# Patient Record
Sex: Male | Born: 1995 | Race: White | Hispanic: No | Marital: Single | Smoking: Never smoker
Health system: Southern US, Community
[De-identification: ages and names within clinical notes are randomized; demographics above are authoritative.]

## PROBLEM LIST (undated history)

## (undated) DIAGNOSIS — R079 Chest pain, unspecified: Secondary | ICD-10-CM

## (undated) DIAGNOSIS — F909 Attention-deficit hyperactivity disorder, unspecified type: Secondary | ICD-10-CM

## (undated) DIAGNOSIS — J45909 Unspecified asthma, uncomplicated: Secondary | ICD-10-CM

## (undated) DIAGNOSIS — T7840XA Allergy, unspecified, initial encounter: Secondary | ICD-10-CM

## (undated) DIAGNOSIS — I10 Essential (primary) hypertension: Secondary | ICD-10-CM

## (undated) DIAGNOSIS — E221 Hyperprolactinemia: Secondary | ICD-10-CM

## (undated) DIAGNOSIS — F419 Anxiety disorder, unspecified: Secondary | ICD-10-CM

## (undated) HISTORY — DX: Allergy, unspecified, initial encounter: T78.40XA

## (undated) HISTORY — DX: Hyperprolactinemia: E22.1

## (undated) HISTORY — DX: Essential (primary) hypertension: I10

## (undated) HISTORY — PX: TONSILLECTOMY AND ADENOIDECTOMY: SUR1326

## (undated) HISTORY — DX: Unspecified asthma, uncomplicated: J45.909

## (undated) HISTORY — DX: Attention-deficit hyperactivity disorder, unspecified type: F90.9

## (undated) HISTORY — DX: Anxiety disorder, unspecified: F41.9

## (undated) HISTORY — PX: TYMPANOSTOMY TUBE PLACEMENT: SHX32

## (undated) HISTORY — DX: Chest pain, unspecified: R07.9

---
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
## 2017-02-05 ENCOUNTER — Ambulatory Visit (INDEPENDENT_AMBULATORY_CARE_PROVIDER_SITE_OTHER): Payer: BLUE CROSS/BLUE SHIELD | Admitting: Family Medicine

## 2017-02-05 ENCOUNTER — Encounter: Payer: Self-pay | Admitting: Family Medicine

## 2017-02-05 VITALS — BP 132/90 | Temp 98.6°F | Ht 72.0 in | Wt 197.0 lb

## 2017-02-05 DIAGNOSIS — R197 Diarrhea, unspecified: Secondary | ICD-10-CM

## 2017-02-05 MED ORDER — METRONIDAZOLE 500 MG PO TABS: 500.0000 mg | ORAL_TABLET | Freq: Three times a day (TID) | ORAL | 0 refills | Status: DC

## 2017-02-05 NOTE — Patient Instructions (Addendum)
Would rec otc probiotics  Probiotic, may use inexpensive generic form    Take all the antibiotics  Stool tests are often not reliable

## 2017-02-05 NOTE — Progress Notes (Signed)
   Subjective:    Patient ID: Aaron Mann, male    DOB: 06/19/1995, 21 y.o.   MRN: [id number]  Abdominal Pain  This is a new problem. Episode onset: 2 weeks  Associated symptoms include vomiting. He has tried nothing for the symptoms.   APP trouble eating with sig diarrhea   twpoo weeks duration   Pt was rxed amox , took 17 out of 20  Pt had stomatitis, and was given meds for it    No actual vom just diarrhea. Having to use the bathroomn  Pt has had a lot iof diarrhea     Review of Systems  Gastrointestinal: Positive for abdominal pain and vomiting.       Objective:   Physical Exam Alert vitals stable, NAD. Blood pressure good on repeat. HEENT normal. Lungs clear. Heart regular rate and rhythm. Abdomen diffuse mild tenderness.  No discrete tenderness.  Bowel sounds present.  No guarding  Protracted diarrhea closed in multiple.  Patient took Suprax.  Also took amoxicillin.  Will cover with Flagyl for 500 3 times daily.  Also add probiotics.  Diarrhea phase great if not contact us and we will set up referral towards Dr. Claretta Fraise       Assessment & Plan:

## 2017-02-17 ENCOUNTER — Encounter: Payer: BLUE CROSS/BLUE SHIELD | Admitting: Family Medicine

## 2017-05-22 IMAGING — CT CT HEAD W/O CM
1 of 2 series · 16 of 30 positions shown, 20 images · non-contrast
Comparison: None.

CLINICAL DATA: Contusion involving the right temple region. No loss
of consciousness.

EXAM:
CT HEAD WITHOUT CONTRAST
TECHNIQUE: Contiguous axial images were obtained from the base of the skull
through the vertex without intravenous contrast.

[Series 3: headtrauma 2.4 h60s · axial · 0.48mm/px · z∈[+1161,+1316]mm · 16 of 72 slices shown, 20 images]
[im 4/72  brain]
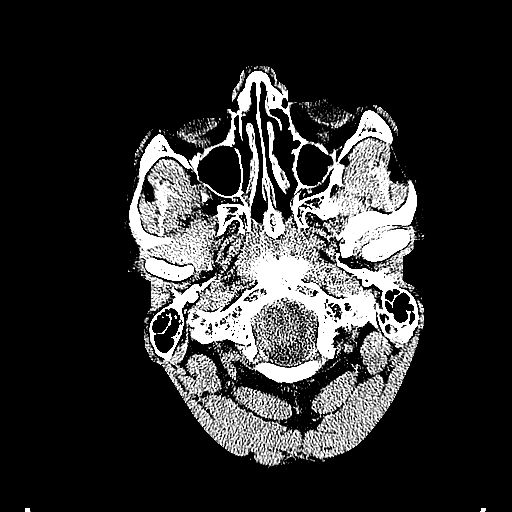
[im 4/72  bone]
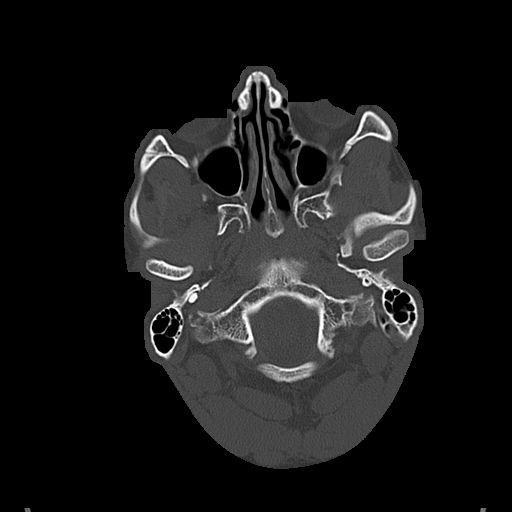
[im 8/72  brain]
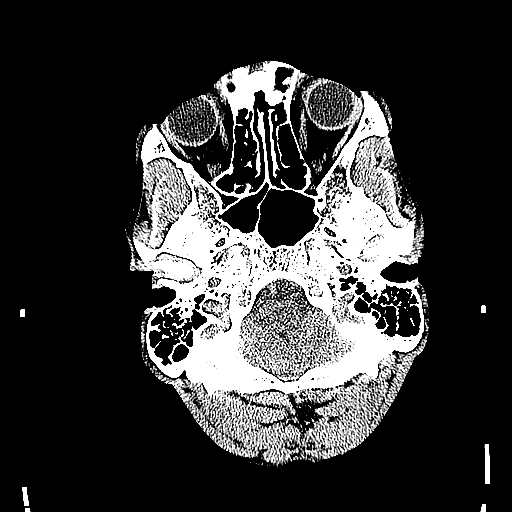
[im 12/72  brain]
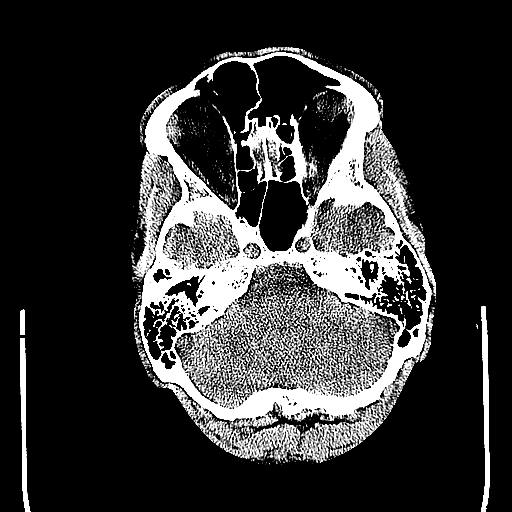
[im 15/72  brain]
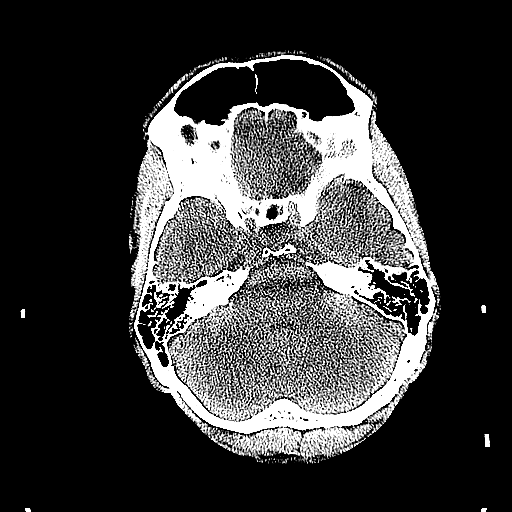
[im 23/72  brain]
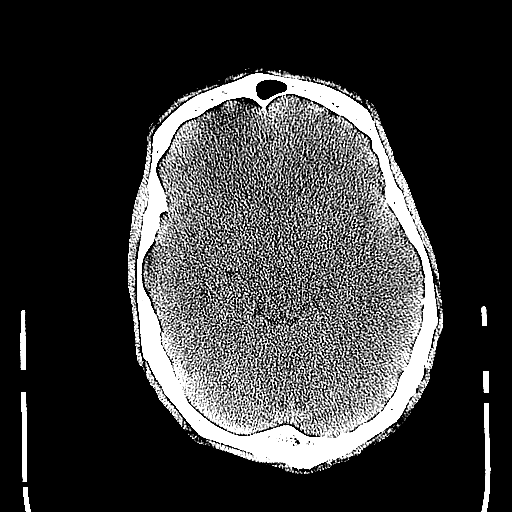
[im 23/72  bone]
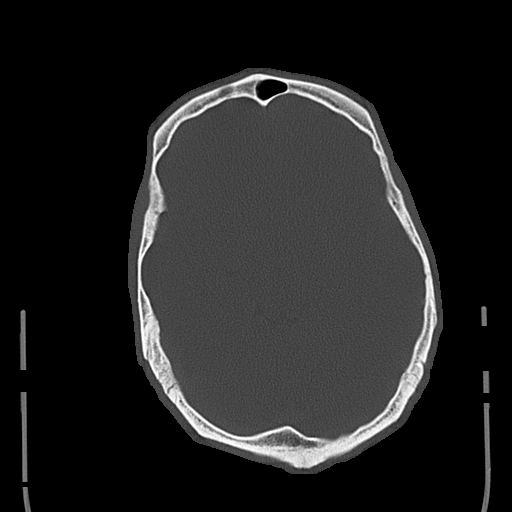
[im 27/72  brain]
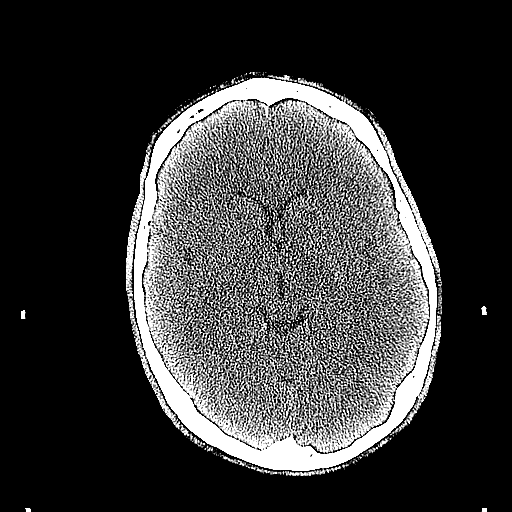
[im 30/72  brain]
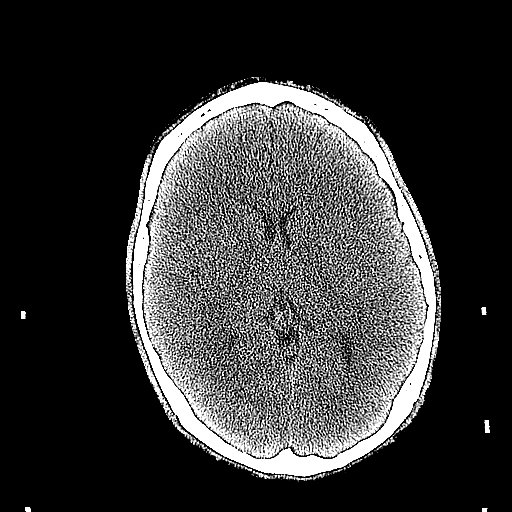
[im 34/72  brain]
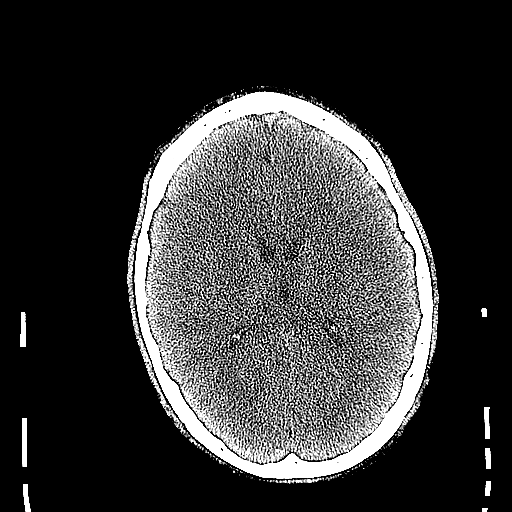
[im 38/72  brain]
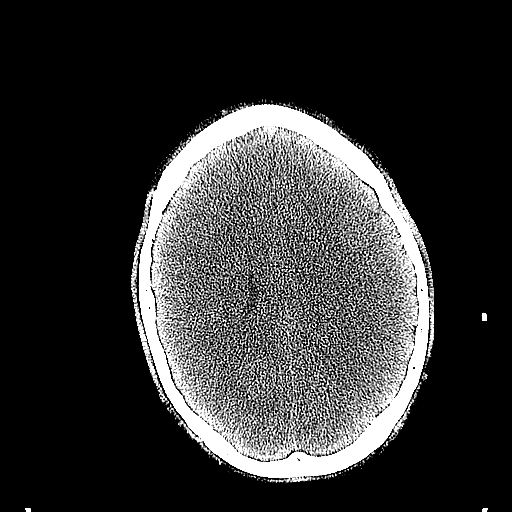
[im 38/72  bone]
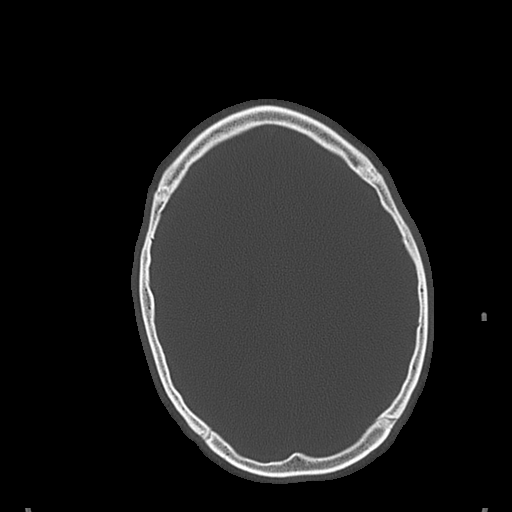
[im 42/72  brain]
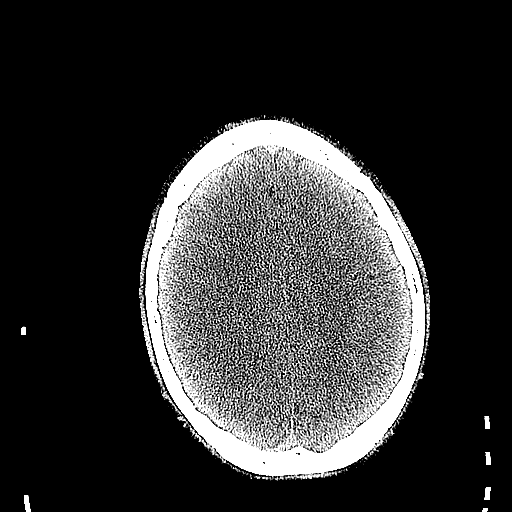
[im 45/72  brain]
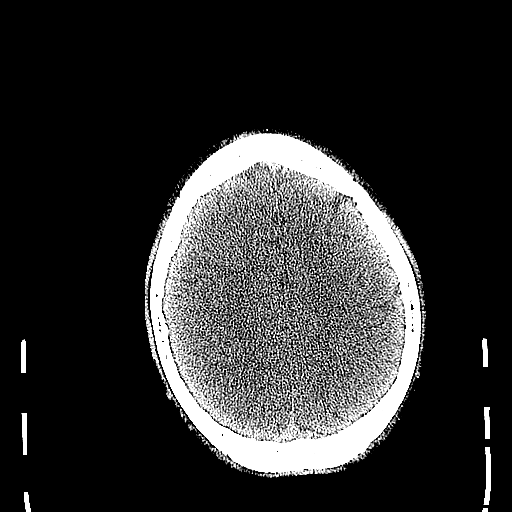
[im 49/72  brain]
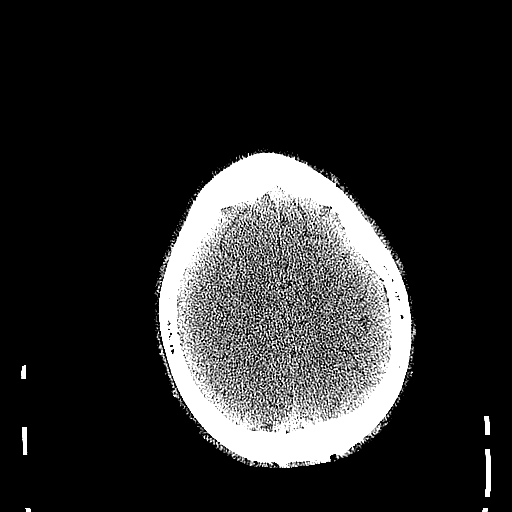
[im 57/72  brain]
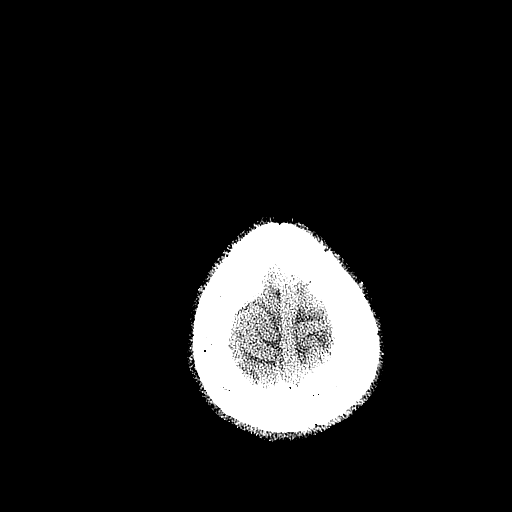
[im 57/72  bone]
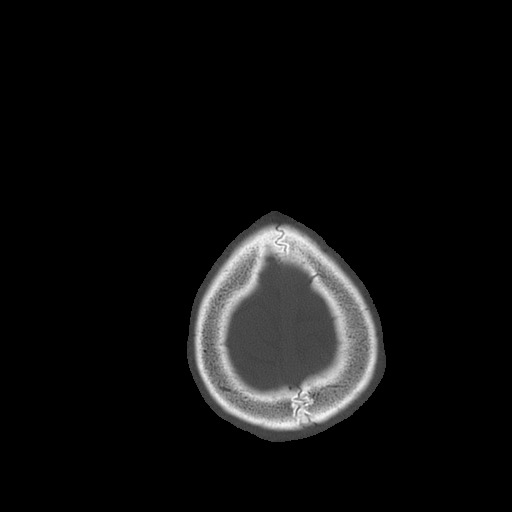
[im 60/72  brain]
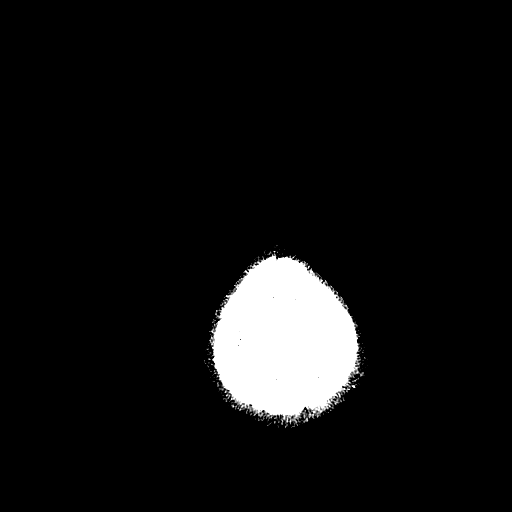
[im 64/72  brain]
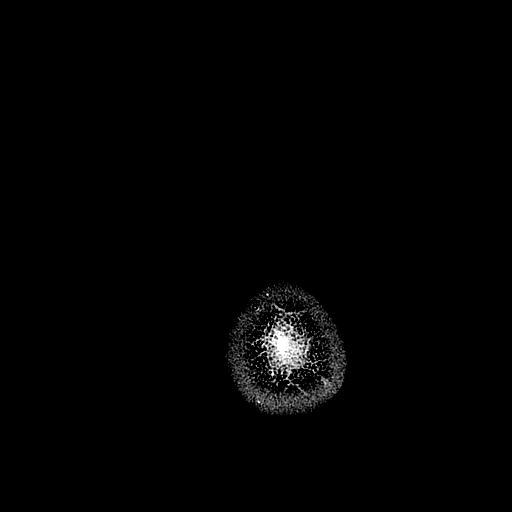
[im 68/72  brain]
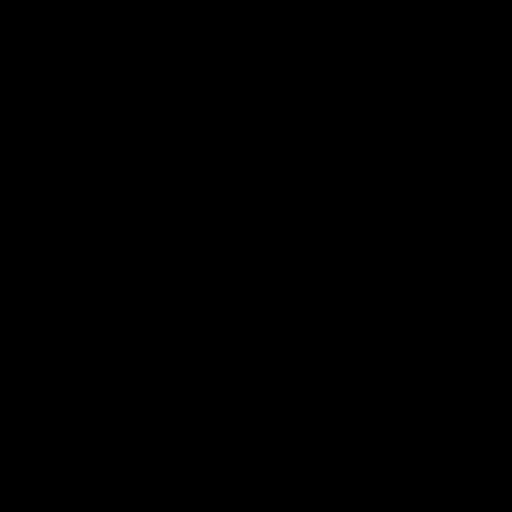

[16 of 30 positions shown; findings below may reference images not displayed]

FINDINGS: Regional soft tissues appear normal with special attention paid to
the area of clinical concern about the right temple. No radiopaque
foreign body. No displaced calvarial fracture.

Gray-white differentiation is maintained. No CT evidence acute large
territory infarct. No intraparenchymal or extra-axial mass or
hemorrhage. Normal size and configuration of the ventricles and
basilar cisterns. No midline shift. Limited visualization of the
paranasal sinuses and mastoid air cells is normal. No air-fluid
levels.
IMPRESSION: Negative noncontrast head CT.

## 2018-11-06 ENCOUNTER — Ambulatory Visit (INDEPENDENT_AMBULATORY_CARE_PROVIDER_SITE_OTHER): Payer: BLUE CROSS/BLUE SHIELD | Admitting: Nurse Practitioner

## 2018-11-06 ENCOUNTER — Other Ambulatory Visit: Payer: Self-pay

## 2018-11-06 DIAGNOSIS — K2100 Gastro-esophageal reflux disease with esophagitis, without bleeding: Secondary | ICD-10-CM

## 2018-11-06 DIAGNOSIS — Z23 Encounter for immunization: Secondary | ICD-10-CM

## 2018-11-06 DIAGNOSIS — D171 Benign lipomatous neoplasm of skin and subcutaneous tissue of trunk: Secondary | ICD-10-CM

## 2018-11-06 DIAGNOSIS — F329 Major depressive disorder, single episode, unspecified: Secondary | ICD-10-CM | POA: Diagnosis not present

## 2018-11-06 DIAGNOSIS — F419 Anxiety disorder, unspecified: Secondary | ICD-10-CM | POA: Diagnosis not present

## 2018-11-06 MED ORDER — CITALOPRAM HYDROBROMIDE 20 MG PO TABS: ORAL_TABLET | ORAL | 0 refills | Status: DC

## 2018-11-06 NOTE — Patient Instructions (Signed)
  Start Omeprazole 20 mg once daily for one month   Food Choices for Gastroesophageal Reflux Disease, Adult When you have gastroesophageal reflux disease (GERD), the foods you eat and your eating habits are very important. Choosing the right foods can help ease your discomfort. Think about working with a nutrition specialist (dietitian) to help you make good choices. What are tips for following this plan?  Meals  Choose healthy foods that are low in fat, such as fruits, vegetables, whole grains, low-fat dairy products, and lean meat, fish, and poultry.  Eat small meals often instead of 3 large meals a day. Eat your meals slowly, and in a place where you are relaxed. Avoid bending over or lying down until 2-3 hours after eating.  Avoid eating meals 2-3 hours before bed.  Avoid drinking a lot of liquid with meals.  Cook foods using methods other than frying. Bake, grill, or broil food instead.  Avoid or limit: ? Chocolate. ? Peppermint or spearmint. ? Alcohol. ? Pepper. ? Black and decaffeinated coffee. ? Black and decaffeinated tea. ? Bubbly (carbonated) soft drinks. ? Caffeinated energy drinks and soft drinks.  Limit high-fat foods such as: ? Fatty meat or fried foods. ? Whole milk, cream, butter, or ice cream. ? Nuts and nut butters. ? Pastries, donuts, and sweets made with butter or shortening.  Avoid foods that cause symptoms. These foods may be different for everyone. Common foods that cause symptoms include: ? Tomatoes. ? Oranges, lemons, and limes. ? Peppers. ? Spicy food. ? Onions and garlic. ? Vinegar. Lifestyle  Maintain a healthy weight. Ask your doctor what weight is healthy for you. If you need to lose weight, work with your doctor to do so safely.  Exercise for at least 30 minutes for 5 or more days each week, or as told by your doctor.  Wear loose-fitting clothes.  Do not smoke. If you need help quitting, ask your doctor.  Sleep with the head of your  bed higher than your feet. Use a wedge under the mattress or blocks under the bed frame to raise the head of the bed. Summary  When you have gastroesophageal reflux disease (GERD), food and lifestyle choices are very important in easing your symptoms.  Eat small meals often instead of 3 large meals a day. Eat your meals slowly, and in a place where you are relaxed.  Limit high-fat foods such as fatty meat or fried foods.  Avoid bending over or lying down until 2-3 hours after eating.  Avoid peppermint and spearmint, caffeine, alcohol, and chocolate. This information is not intended to replace advice given to you by your health care provider. Make sure you discuss any questions you have with your health care provider. Document Released: 07/30/2011 Document Revised: 05/21/2018 Document Reviewed: 03/05/2016 Elsevier Patient Education  2020 [employer].

## 2018-11-06 NOTE — Progress Notes (Signed)
Subjective:    Patient ID: Aaron Mann, male    DOB: [birth year], 23 y.o.   MRN: [id number]  Anxiety Presents for initial visit. Onset was 6 to 12 months ago. Symptoms include chest pain, excessive worry and panic.     Pt having a heavy feeling in chest area.  Spot on lower back at tailbone. Sometimes painful/tender. Pt states not getting bigger. Presents for complaints of a possible cyst near the tailbone area that is been there for several months.  No change in size.  Slightly painful at times only after he has been sitting for prolonged periods of time.  Has had one episode of slight numbness in the left leg.  Otherwise no change in activity.  Also noticed a knot in his right axillary area which has resolved.  Has a history of chronic anxiety.  Is currently going to college and is noticed a significant increase lately.  Denies suicidal or homicidal thoughts or ideation.  Experiencing panic attacks at times.  In addition patient has been experiencing some acid reflux symptoms.  Burning and pain in the epigastric area going up into the esophagus at times.  When this occurs, patient has some sharp chest pain.  Unassociated with activity.  Has increased his caffeine intake recently.  Rare social alcohol use.  No tobacco use.  No excessive NSAID use.  Zoloft did not help his anxiety symptoms and seem to increase his agitation.  Has not tried any other medications at this point. GAD 7 : Generalized Anxiety Score 11/06/2018  Nervous, Anxious, on Edge 3  Control/stop worrying 2  Worry too much - different things 2  Trouble relaxing 2  Restless 2  Easily annoyed or irritable 3  Afraid - awful might happen 3  Total GAD 7 Score 17  Anxiety Difficulty Very difficult       Review of Systems  Cardiovascular: Positive for chest pain.       Objective:   Physical Exam NAD.  Alert, oriented.  Mildly anxious affect.  Fidgety.  Rapid speech at times.  Lungs clear.  Heart regular rate rhythm.  A  mobile well-defined cystic area approximately 1 cm in diameter noted to the left of the sacral spine area.  Nontender to palpation.  Abdomen soft nondistended with distinct moderate epigastric area tenderness.  No rebound or guarding.  No obvious masses.       Assessment & Plan:   Problem List Items Addressed This Visit      Other   Anxiety and depression - Primary   Relevant Medications   citalopram (CELEXA) 20 MG tablet    Other Visit Diagnoses    Lipoma of back       Gastroesophageal reflux disease with esophagitis       Need for vaccination       Relevant Orders   Flu Vaccine QUAD 6+ mos PF IM (Fluarix Quad PF) (Completed)     Probable lipoma of the back area.  Offered surgical referral but patient wishes to discuss this with his mother first who is a Marine scientist.  Flu vaccine today. Start omeprazole 20 mg OTC daily for the next month.  Given written and verbal information on lifestyle factors affecting his reflux.  Encourage patient to decrease his caffeine intake.  Warning signs reviewed. Start Celexa 20 mg as directed.  Reviewed potential adverse effects.  DC med and call if any problems. Return in about 1 month (around 12/06/2018) for virtual or office visit. Contact office  sooner if any problems. 25 minutes was spent with the patient.  This statement verifies that 25 minutes was indeed spent with the patient.  More than 50% of this visit-total duration of the visit-was spent in counseling and coordination of care. The issues that the patient came in for today as reflected in the diagnosis (s) please refer to documentation for further details.

## 2018-11-07 ENCOUNTER — Encounter: Payer: Self-pay | Admitting: Nurse Practitioner

## 2018-11-07 MED ORDER — EPINEPHRINE 0.3 MG/0.3ML IJ SOAJ: 0.3000 mg | Freq: Once | INTRAMUSCULAR | 0 refills | Status: AC

## 2019-03-08 ENCOUNTER — Other Ambulatory Visit: Payer: Self-pay

## 2019-03-08 ENCOUNTER — Encounter: Payer: Self-pay | Admitting: Family Medicine

## 2019-03-08 ENCOUNTER — Ambulatory Visit (INDEPENDENT_AMBULATORY_CARE_PROVIDER_SITE_OTHER): Payer: BLUE CROSS/BLUE SHIELD | Admitting: Family Medicine

## 2019-03-08 VITALS — BP 146/90 | Ht 72.5 in | Wt 223.0 lb

## 2019-03-08 DIAGNOSIS — F419 Anxiety disorder, unspecified: Secondary | ICD-10-CM | POA: Diagnosis not present

## 2019-03-08 DIAGNOSIS — I10 Essential (primary) hypertension: Secondary | ICD-10-CM | POA: Diagnosis not present

## 2019-03-08 DIAGNOSIS — Z0000 Encounter for general adult medical examination without abnormal findings: Secondary | ICD-10-CM | POA: Diagnosis not present

## 2019-03-08 DIAGNOSIS — F329 Major depressive disorder, single episode, unspecified: Secondary | ICD-10-CM

## 2019-03-08 MED ORDER — BUPROPION HCL ER (SR) 150 MG PO TB12: ORAL_TABLET | ORAL | 3 refills | Status: DC

## 2019-03-08 MED ORDER — ALBUTEROL SULFATE HFA 108 (90 BASE) MCG/ACT IN AERS: 2.0000 | INHALATION_SPRAY | Freq: Four times a day (QID) | RESPIRATORY_TRACT | 0 refills | Status: DC | PRN

## 2019-03-08 MED ORDER — EPINEPHRINE 0.3 MG/0.3ML IJ SOAJ: 0.3000 mg | INTRAMUSCULAR | 0 refills | Status: AC | PRN
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
## 2019-03-08 NOTE — Patient Instructions (Signed)

## 2019-03-10 ENCOUNTER — Encounter: Payer: Self-pay | Admitting: Family Medicine

## 2019-03-11 ENCOUNTER — Encounter: Payer: Self-pay | Admitting: Family Medicine

## 2019-04-22 LAB — CBC WITH DIFFERENTIAL/PLATELET
Basophils Absolute: 0 10*3/uL (ref 0.0–0.2)
Basos: 1 %
EOS (ABSOLUTE): 0.1 10*3/uL (ref 0.0–0.4)
Eos: 2 %
Hematocrit: 47.6 % (ref 37.5–51.0)
Hemoglobin: 16.9 g/dL (ref 13.0–17.7)
Immature Grans (Abs): 0 10*3/uL (ref 0.0–0.1)
Immature Granulocytes: 0 %
Lymphocytes Absolute: 2.2 10*3/uL (ref 0.7–3.1)
Lymphs: 49 %
MCH: 30.6 pg (ref 26.6–33.0)
MCHC: 35.5 g/dL (ref 31.5–35.7)
MCV: 86 fL (ref 79–97)
Monocytes Absolute: 0.4 10*3/uL (ref 0.1–0.9)
Monocytes: 9 %
Neutrophils Absolute: 1.8 10*3/uL (ref 1.4–7.0)
Neutrophils: 39 %
Platelets: 197 10*3/uL (ref 150–450)
RBC: 5.52 x10E6/uL (ref 4.14–5.80)
RDW: 12.5 % (ref 11.6–15.4)
WBC: 4.5 10*3/uL (ref 3.4–10.8)

## 2019-04-22 LAB — BASIC METABOLIC PANEL
BUN/Creatinine Ratio: 12 (ref 9–20)
BUN: 12 mg/dL (ref 6–20)
CO2: 23 mmol/L (ref 20–29)
Calcium: 9.9 mg/dL (ref 8.7–10.2)
Chloride: 102 mmol/L (ref 96–106)
Creatinine, Ser: 1.02 mg/dL (ref 0.76–1.27)
GFR calc Af Amer: 121 mL/min/{1.73_m2} (ref >59)
GFR calc non Af Amer: 105 mL/min/{1.73_m2} (ref >59)
Glucose: 81 mg/dL (ref 65–99)
Potassium: 4.7 mmol/L (ref 3.5–5.2)
Sodium: 139 mmol/L (ref 134–144)

## 2019-04-22 LAB — HEPATIC FUNCTION PANEL
ALT: 20 IU/L (ref 0–44)
AST: 17 IU/L (ref 0–40)
Albumin: 4.8 g/dL (ref 4.1–5.2)
Alkaline Phosphatase: 73 IU/L (ref 39–117)
Bilirubin Total: 0.4 mg/dL (ref 0.0–1.2)
Bilirubin, Direct: 0.13 mg/dL (ref 0.00–0.40)
Total Protein: 7 g/dL (ref 6.0–8.5)

## 2019-04-22 LAB — LIPID PANEL
Chol/HDL Ratio: 3.4 ratio (ref 0.0–5.0)
Cholesterol, Total: 166 mg/dL (ref 100–199)
HDL: 49 mg/dL (ref >39)
LDL Chol Calc (NIH): 104 mg/dL — ABNORMAL HIGH (ref 0–99)
Triglycerides: 64 mg/dL (ref 0–149)
VLDL Cholesterol Cal: 13 mg/dL (ref 5–40)

## 2019-04-22 LAB — TSH: TSH: 2.49 u[IU]/mL (ref 0.450–4.500)

## 2019-05-03 ENCOUNTER — Ambulatory Visit (INDEPENDENT_AMBULATORY_CARE_PROVIDER_SITE_OTHER): Payer: BLUE CROSS/BLUE SHIELD | Admitting: Family Medicine

## 2019-05-03 ENCOUNTER — Other Ambulatory Visit: Payer: Self-pay

## 2019-05-03 ENCOUNTER — Encounter: Payer: Self-pay | Admitting: Family Medicine

## 2019-05-03 VITALS — BP 138/90 | Temp 96.4°F | Wt 223.0 lb

## 2019-05-03 DIAGNOSIS — F419 Anxiety disorder, unspecified: Secondary | ICD-10-CM | POA: Diagnosis not present

## 2019-05-03 DIAGNOSIS — I10 Essential (primary) hypertension: Secondary | ICD-10-CM

## 2019-05-03 DIAGNOSIS — F329 Major depressive disorder, single episode, unspecified: Secondary | ICD-10-CM | POA: Diagnosis not present

## 2019-05-03 MED ORDER — BUPROPION HCL ER (SR) 150 MG PO TB12: ORAL_TABLET | ORAL | 5 refills | Status: DC

## 2019-05-03 MED ORDER — ALBUTEROL SULFATE HFA 108 (90 BASE) MCG/ACT IN AERS: 2.0000 | INHALATION_SPRAY | Freq: Four times a day (QID) | RESPIRATORY_TRACT | 5 refills | Status: AC | PRN

## 2019-05-03 NOTE — Progress Notes (Signed)
   Subjective:    Patient ID: Aaron Mann, male    DOB: [birth year], 24 y.o.   MRN: [id number]  HPI Pt here today for follow up. Pt states no issues with Wellbutrin. Pt states he is still having some chest tightness. Pt states he does have some shortness of breath at times but believes that is due to not exercising as much. Pt has not checked bp recently.    Walking some  Still feeling a bit tired  Chest feels a little tight at tiens   Mood has improved a bit, still somewhat anxious, eds helping some. Doing counsellig       Review of Systems No headache no weight loss no fever no cough    Objective:   Physical Exam Alert vitals stable, NAD. Blood pressure good on repeat. HEENT normal. Lungs clear. Heart regular rate and rhythm.        Assessment & Plan:  Impression 1 elevated blood pressure.  Today blood pressure is good at 0000000 systolic and [id number] diastolic.  Discussion held.  Strong family history of hypertension.  Patient over the last few years approximately half of his numbers are elevated when he first presents.  I do think he will eventually need to be on medication.  Patient to work harder on exercise and this regard  2.  Generalized anxiety disorder/mood disorder.  Discussed.  To maintain Wellbutrin same dose.  6 months worth given.  Follow-up then or with new primary care doctor since patient is moving to [facility] I have encouraged him to consider new primary care doctor closer to home

## 2019-08-13 ENCOUNTER — Other Ambulatory Visit: Payer: Self-pay | Admitting: Family Medicine

## 2019-08-13 NOTE — Telephone Encounter (Signed)
Please contact patient to have him set up appt to establish care with Dr.Taylor. Thank you

## 2019-08-13 NOTE — Telephone Encounter (Signed)
Contacted patient. Pt states he would like to keep [place] as PCP because he does come home at times. Pt is not moving to [facility] until August 1st. Pt is using [facility] [place] at this time and when he fills out the [facility] area more, he will find a pharmacy closer. Pt does need another script sent in if possible. Please advise. Thank you

## 2019-08-13 NOTE — Telephone Encounter (Signed)
Pt to be seen in office in next 1-2 months to establish care. Thx. Dr. Darene Lamer

## 2019-08-13 NOTE — Telephone Encounter (Signed)
Find status about pcp, last note pt was moving to [place].  Needs pcp in that area if so.    Can give small amt if needed.   Thx,   Dr. Lovena Le

## 2019-08-17 NOTE — Telephone Encounter (Signed)
Pt will call back to schedule appt before refill is needed.

## 2021-03-30 ENCOUNTER — Ambulatory Visit: Payer: BLUE CROSS/BLUE SHIELD | Admitting: Cardiology

## 2021-04-10 ENCOUNTER — Ambulatory Visit: Payer: BLUE CROSS/BLUE SHIELD | Admitting: Interventional Cardiology

## 2021-04-25 ENCOUNTER — Ambulatory Visit: Payer: BLUE CROSS/BLUE SHIELD | Admitting: Interventional Cardiology

## 2021-05-10 ENCOUNTER — Encounter: Payer: Self-pay | Admitting: Internal Medicine

## 2021-05-10 ENCOUNTER — Ambulatory Visit: Payer: BLUE CROSS/BLUE SHIELD | Admitting: Internal Medicine

## 2021-05-10 VITALS — BP 135/80 | HR 53 | Ht 73.0 in | Wt 220.0 lb

## 2021-05-10 DIAGNOSIS — R079 Chest pain, unspecified: Secondary | ICD-10-CM

## 2021-05-10 NOTE — Progress Notes (Signed)
?Cardiology Office Note:   ? ?Date:  05/10/2021  ? ?ID:  Aaron Mann, DOB 1996/01/26, MRN [id number] ? ?PCP:  Pablo Lawrence, NP ?  ?[facility] Providers ?Cardiologist:  None    ? ?Referring MD: Jacelyn Pi, MD  ? ?CC: New CP ?Consulted for the evaluation of chest pain at the behest of Pablo Lawrence, NP ? ?History of Present Illness:   ? ?Aaron Mann is a 26 y.o. male with a hx of HTN, anxiety, who presents for the evaluation of chest pain. ? ?Patient notes that he is feeling chest pain for several months.  Sternal CP with no radiation.  Comes and goes.  Discomfort occurs spontaneously and gets better on its own. Patient exertion notable for doing cardio (works out every day) also dose weightlifting  and feels no symptoms.  His chest pain is different that weightlifting.  No shortness of breath, DOE.  No PND or orthopnea.  No weight gain, leg swelling , or abdominal swelling.  No syncope or near syncope . Notes  no palpitations or funny heart beats.   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 ? ?Past Medical History:  ?Diagnosis Date  ? ADHD (attention deficit hyperactivity disorder)   ? Allergy   ? Anxiety   ? Asthma   ? Chest pain   ? HTN (hypertension)   ? Hyperprolactinemia ([place])   ? ? ?Past Surgical History:  ?Procedure Laterality Date  ? TONSILLECTOMY AND ADENOIDECTOMY    ? TYMPANOSTOMY TUBE PLACEMENT    ? ? ?Current Medications: ?Current Meds  ?Medication Sig  ? albuterol (VENTOLIN HFA) 108 (90 Base) MCG/ACT inhaler Inhale 2 puffs into the lungs every 6 (six) hours as needed for wheezing or shortness of breath.  ? amphetamine-dextroamphetamine (ADDERALL XR) 20 MG 24 hr capsule Take 20 mg by mouth daily.  ? amphetamine-dextroamphetamine (ADDERALL) 10 MG tablet Take 10 mg by mouth daily with breakfast.  ? Biotin 10 MG TABS Take by mouth.  ? budesonide-formoterol (SYMBICORT) 160-4.5 MCG/ACT inhaler Inhale 2 puffs into the lungs 2 (two) times daily.  ? emtricitabine-tenofovir  (TRUVADA) 200-300 MG tablet Take 1 tablet by mouth daily.  ? EPINEPHrine 0.3 mg/0.3 mL IJ SOAJ injection Inject 0.3 mLs (0.3 mg total) into the muscle as needed for anaphylaxis.  ? finasteride (PROSCAR) 5 MG tablet Take 5 mg by mouth daily.  ? fluticasone (FLONASE) 50 MCG/ACT nasal spray Place into both nostrils as needed for allergies or rhinitis.  ? montelukast (SINGULAIR) 10 MG tablet Take 10 mg by mouth as needed.  ? Omega-3 Fatty Acids (FISH OIL) 1000 MG CAPS Take by mouth daily.  ? valACYclovir (VALTREX) 500 MG tablet Take by mouth as needed. Cold sores  ?  ? ?Allergies:   Peanuts [peanut oil] and Penicillins  ? ?Social History  ? ?Socioeconomic History  ? Marital status: Single  ?  Spouse name: Not on file  ? Number of children: Not on file  ? Years of education: Not on file  ? Highest education level: Not on file  ?Occupational History  ? Not on file  ?Tobacco Use  ? Smoking status: Never  ? Smokeless tobacco: Never  ?Substance and Sexual Activity  ? Alcohol use: No  ? Drug use: No  ? Sexual activity: Not on file  ?Other Topics Concern  ? Not on file  ?Social History Narrative  ? Not on file  ? ?Social Determinants of Health  ? ?Financial Resource Strain: Not on  file  ?Food Insecurity: Not on file  ?Transportation Needs: Not on file  ?Physical Activity: Not on file  ?Stress: Not on file  ?Social Connections: Not on file  ?  ? ?Family History: ?The patient's family history includes ADD / ADHD in his maternal uncle, sister, and sister; Anxiety disorder in his sister; Asthma in his maternal uncle and sister; Cancer in his maternal grandfather; Diabetes in his maternal aunt; Heart disease in his paternal grandfather; Hypertension in his maternal aunt, maternal grandmother, paternal aunt, paternal grandfather, and paternal grandmother; Learning disabilities in his father; Migraines in his mother and paternal aunt; Thyroid disease in his maternal aunt and maternal grandmother. ? ?ROS:   ?Please see the history of  present illness.    ? All other systems reviewed and are negative. ? ?EKGs/Labs/Other Studies Reviewed:   ? ?The following studies were reviewed today: ? ?EKG:  EKG is  ordered today.  The ekg ordered today demonstrates  ?05/10/21: Sinus bradycardia  ? ?Recent Labs: ?No results found for requested labs within last 8760 hours.  ?Recent Lipid Panel ?   ?Component Value Date/Time  ? CHOL 166 04/21/2019 1037  ? TRIG 64 04/21/2019 1037  ? HDL 49 04/21/2019 1037  ? CHOLHDL 3.4 04/21/2019 1037  ? LDLCALC 104 (H) 04/21/2019 1037  ?    ? ?Physical Exam:   ? ?VS:  BP 135/80   Pulse (!) 53   Ht '6\' 1"'$  (1.854 m)   Wt 220 lb (99.8 kg)   SpO2 100%   BMI 29.03 kg/m?    ? ?Wt Readings from Last 3 Encounters:  ?05/10/21 220 lb (99.8 kg)  ?05/03/19 223 lb (101.2 kg)  ?03/08/19 223 lb (101.2 kg)  ?  ?Gen: no distress   ?Neck: No JVD ?Ears: No Pilar Plate Sign ?Cardiac: No Rubs or Gallops, No Murmur, RRR, +2 radial pulses ?Respiratory: Clear to auscultation bilaterally, normal effort, normal  respiratory rate ?GI: Soft, nontender, non-distended  ?MS: No  edema;  moves all extremities ?Integument: Skin feels warm ?Neuro:  At time of evaluation, alert and oriented to person/place/time/situation  ?Psych: Normal affect, patient feels well ? ? ?ASSESSMENT:   ? ?1. Chest pain of uncertain etiology   ? ?PLAN:   ? ?Non Cardiac Chest Pain ?Hx of HTN ?- he has a high level of cardiovascular fitness and has had no sx; this appears to be musculoskeletal ?- I have offered POET; after SDM we have decided to only do this for exertional sx or new red-flag findings. ? ?PRN follow up ? ?   ? ?Shared Decision Making/Informed Consent ?The risks [chest pain, shortness of breath, cardiac arrhythmias, dizziness, blood pressure fluctuations, myocardial infarction, stroke/transient ischemic attack, and life-threatening complications (estimated to be 1 in 10,000)], benefits (risk stratification, diagnosing coronary artery disease, treatment guidance) and  alternatives of an exercise tolerance test were discussed in detail with Mr. Gesner and he agrees to proceed.  ? ? ?Medication Adjustments/Labs and Tests Ordered: ?Current medicines are reviewed at length with the patient today.  Concerns regarding medicines are outlined above.  ?Orders Placed This Encounter  ?Procedures  ? Cardiac Stress Test: Informed Consent Details: Physician/Practitioner Attestation; Transcribe to consent form and obtain patient signature  ? ?No orders of the defined types were placed in this encounter. ? ? ?Patient Instructions  ?Medication Instructions:  ?Your physician recommends that you continue on your current medications as directed. Please refer to the Current Medication list given to you today. ? ?*If you need a  refill on your cardiac medications before your next appointment, please call your pharmacy* ? ? ?Lab Work: ?NONE ?If you have labs (blood work) drawn today and your tests are completely normal, you will receive your results only by: ?MyChart Message (if you have MyChart) OR ?A paper copy in the mail ?If you have any lab test that is abnormal or we need to change your treatment, we will call you to review the results. ? ? ?Testing/Procedures: ?NONE ? ? ?Follow-Up: As needed ?At [facility], you and your health needs are our priority.  As part of our continuing mission to provide you with exceptional heart care, we have created designated Provider Care Teams.  These Care Teams include your primary Cardiologist (physician) and Advanced Practice Providers (APPs -  Physician Assistants and Nurse Practitioners) who all work together to provide you with the care you need, when you need it. ? ?We recommend signing up for the patient portal called "MyChart".  Sign up information is provided on this After Visit Summary.  MyChart is used to connect with patients for Virtual Visits (Telemedicine).  Patients are able to view lab/test results, encounter notes, upcoming appointments, etc.   Non-urgent messages can be sent to your provider as well.   ?To learn more about what you can do with MyChart, go to [email or web address].   ? ? ?Provider:  Rudean Haskell, MD  ? ?  ? ?Signed, ?Seraiah Nowack A [place]

## 2021-05-10 NOTE — Patient Instructions (Addendum)
Medication Instructions:  ?Your physician recommends that you continue on your current medications as directed. Please refer to the Current Medication list given to you today. ? ?*If you need a refill on your cardiac medications before your next appointment, please call your pharmacy* ? ? ?Lab Work: ?NONE ?If you have labs (blood work) drawn today and your tests are completely normal, you will receive your results only by: ?MyChart Message (if you have MyChart) OR ?A paper copy in the mail ?If you have any lab test that is abnormal or we need to change your treatment, we will call you to review the results. ? ? ?Testing/Procedures: ?NONE ? ? ?Follow-Up: As needed ?At [facility], you and your health needs are our priority.  As part of our continuing mission to provide you with exceptional heart care, we have created designated Provider Care Teams.  These Care Teams include your primary Cardiologist (physician) and Advanced Practice Providers (APPs -  Physician Assistants and Nurse Practitioners) who all work together to provide you with the care you need, when you need it. ? ?We recommend signing up for the patient portal called "MyChart".  Sign up information is provided on this After Visit Summary.  MyChart is used to connect with patients for Virtual Visits (Telemedicine).  Patients are able to view lab/test results, encounter notes, upcoming appointments, etc.  Non-urgent messages can be sent to your provider as well.   ?To learn more about what you can do with MyChart, go to [email or web address].   ? ? ?Provider:  Rudean Haskell, MD  ? ? ?

## 2021-05-14 NOTE — Addendum Note (Signed)
Addended by: Janan Halter F on: 05/14/2021 03:45 PM ? ? Modules accepted: Orders ? ?
